# Patient Record
Sex: Male | Born: 1975 | Race: Black or African American | Hispanic: No | Smoking: Never smoker
Health system: Southern US, Community
[De-identification: ages and names within clinical notes are randomized; demographics above are authoritative.]

## PROBLEM LIST (undated history)

## (undated) HISTORY — PX: KNEE SURGERY: SHX244

---

## 2003-11-02 ENCOUNTER — Ambulatory Visit (HOSPITAL_COMMUNITY): Admission: RE | Admit: 2003-11-02 | Discharge: 2003-11-02 | Payer: Self-pay | Admitting: Orthopedic Surgery

## 2003-11-14 ENCOUNTER — Encounter: Admission: RE | Admit: 2003-11-14 | Discharge: 2004-02-12 | Payer: Self-pay | Admitting: Orthopedic Surgery

## 2004-05-06 ENCOUNTER — Ambulatory Visit (HOSPITAL_BASED_OUTPATIENT_CLINIC_OR_DEPARTMENT_OTHER): Admission: RE | Admit: 2004-05-06 | Discharge: 2004-05-06 | Payer: Self-pay | Admitting: Orthopedic Surgery

## 2004-05-17 ENCOUNTER — Emergency Department (HOSPITAL_COMMUNITY): Admission: EM | Admit: 2004-05-17 | Discharge: 2004-05-17 | Payer: Self-pay | Admitting: Family Medicine

## 2004-05-22 ENCOUNTER — Encounter: Admission: RE | Admit: 2004-05-22 | Discharge: 2004-08-20 | Payer: Self-pay | Admitting: Orthopedic Surgery

## 2004-09-20 ENCOUNTER — Emergency Department (HOSPITAL_COMMUNITY): Admission: EM | Admit: 2004-09-20 | Discharge: 2004-09-20 | Payer: Self-pay | Admitting: Emergency Medicine

## 2004-09-29 ENCOUNTER — Emergency Department (HOSPITAL_COMMUNITY): Admission: EM | Admit: 2004-09-29 | Discharge: 2004-09-29 | Payer: Self-pay | Admitting: Family Medicine

## 2004-11-05 ENCOUNTER — Emergency Department (HOSPITAL_COMMUNITY): Admission: EM | Admit: 2004-11-05 | Discharge: 2004-11-05 | Payer: Self-pay | Admitting: Family Medicine

## 2004-11-11 ENCOUNTER — Emergency Department (HOSPITAL_COMMUNITY): Admission: EM | Admit: 2004-11-11 | Discharge: 2004-11-11 | Payer: Self-pay | Admitting: Family Medicine

## 2004-11-21 ENCOUNTER — Encounter: Admission: RE | Admit: 2004-11-21 | Discharge: 2004-12-23 | Payer: Self-pay | Admitting: Orthopedic Surgery

## 2005-08-13 ENCOUNTER — Emergency Department (HOSPITAL_COMMUNITY): Admission: EM | Admit: 2005-08-13 | Discharge: 2005-08-13 | Payer: Self-pay | Admitting: Family Medicine

## 2005-10-18 ENCOUNTER — Emergency Department (HOSPITAL_COMMUNITY): Admission: EM | Admit: 2005-10-18 | Discharge: 2005-10-18 | Payer: Self-pay | Admitting: Family Medicine

## 2006-07-05 ENCOUNTER — Emergency Department (HOSPITAL_COMMUNITY): Admission: EM | Admit: 2006-07-05 | Discharge: 2006-07-05 | Payer: Self-pay | Admitting: Emergency Medicine

## 2008-02-06 ENCOUNTER — Emergency Department (HOSPITAL_COMMUNITY): Admission: EM | Admit: 2008-02-06 | Discharge: 2008-02-06 | Payer: Self-pay | Admitting: Emergency Medicine

## 2008-05-22 ENCOUNTER — Emergency Department (HOSPITAL_COMMUNITY): Admission: EM | Admit: 2008-05-22 | Discharge: 2008-05-22 | Payer: Self-pay | Admitting: Family Medicine

## 2008-10-27 ENCOUNTER — Encounter (INDEPENDENT_AMBULATORY_CARE_PROVIDER_SITE_OTHER): Payer: Self-pay | Admitting: Orthopedic Surgery

## 2008-10-27 ENCOUNTER — Ambulatory Visit: Payer: Self-pay | Admitting: Vascular Surgery

## 2008-10-27 ENCOUNTER — Ambulatory Visit: Admission: RE | Admit: 2008-10-27 | Discharge: 2008-10-27 | Payer: Self-pay | Admitting: Orthopedic Surgery

## 2010-08-11 ENCOUNTER — Emergency Department (HOSPITAL_COMMUNITY)
Admission: EM | Admit: 2010-08-11 | Discharge: 2010-08-11 | Payer: Self-pay | Source: Home / Self Care | Admitting: Emergency Medicine

## 2010-11-29 NOTE — Op Note (Signed)
Jacob Rubio, Jacob Rubio              ACCOUNT NO.:  [id number]   MEDICAL RECORD NO.:  [id number]          PATIENT TYPE:  AMB   LOCATION:  DSC                          FACILITY:  [facility]   PHYSICIAN:  John L. Rendall III, M.D.DATE OF BIRTH:  1976-01-20   DATE OF PROCEDURE:  05/06/2004  DATE OF DISCHARGE:                                 OPERATIVE REPORT   INDICATION AND JUSTIFICATION FOR PROCEDURE:  History of chronic knee pain  after an injury at work with MRI not impressive.  He does, however, have a  painful loose ossicle at the tibial tubercle and ill defined pain about the  patella.   JUSTIFICATION FOR OUTPATIENT SETTING:  Inpatient not required.   PREOPERATIVE DIAGNOSIS:  1.  Painful loose ossicle at tibial tubercle.  2.  Knee pain, question etiology.   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   OPERATIVE PROCEDURE:  1.  Diagnostic arthroscopy with chondroplasty with femoral trochlea and      margin medial femoral condyle.  2.  Excision loose ossicle at tibial tubercle, 1.1 by 1 by 1 cm.   SURGEON:  John L. Rendall, M.D.   ANESTHESIA:  General.   PROCEDURE:  Under a brief general anesthesia, the right knee was prepared  with DuraPrep and draped as a sterile field.  Portals were injected with  Xylocaine with epinephrine.  A 42M pump was used.  Diagnostic arthroscopy  reveals normal menisci and ACL, but there is a 5-6 mm wide defect down the  center of the femoral trochlea over about 2 cm.  The under surface of the  patella is intact.  The margins of this groove, which is down to bone, are  peeling using an intra-articular shaver.  The peeling hyaline cartilage is  resected.  In addition, minor flap of chondromalacia at the margin of the  medial femoral condyle is also resected with the shaver.  Once this is  completed, a 1 inch incision is made over the tibial tubercle.  The  patellar  tendon is separated with an incision right down the middle of the inferior  patellar tendon at the attachment.  Fibers are not cut but an ossicle is  exposed and tendon is removed from the ossicle which is found to be loose.  It is almost like there is a pseudoarthrosis of the tibial tubercle at this  ossicle.  It is essentially shelled out, being a little more than 1 cm by 1  cm by 1 cm in all three dimensions.  Once it is removed, the patellar tendon  is sutured side-to-side with #1 Vicryl mattress sutures, subcu  with 2-0 Vicryl, and the skin with clips.  The wound is infiltrated with  Xylocaine with epinephrine and a sterile compression bandage is applied.  The patient returned to the recovery room in good condition.  He was given  Vicodin for pain.  Recheck in the office next Tuesday.      Daisy Blossom  D:  05/06/2004  T:  05/06/2004  Job:  [id number] 

## 2011-06-10 ENCOUNTER — Other Ambulatory Visit: Payer: Self-pay | Admitting: Rehabilitation

## 2011-06-10 DIAGNOSIS — M545 Low back pain, unspecified: Secondary | ICD-10-CM

## 2011-06-14 ENCOUNTER — Ambulatory Visit
Admission: RE | Admit: 2011-06-14 | Discharge: 2011-06-14 | Disposition: A | Discharge status: Home / Self Care | Payer: Managed Care, Other (non HMO) | Source: Ambulatory Visit | Attending: Rehabilitation | Admitting: Rehabilitation

## 2011-06-14 DIAGNOSIS — M545 Low back pain, unspecified: Secondary | ICD-10-CM

## 2012-01-25 ENCOUNTER — Emergency Department (HOSPITAL_COMMUNITY): Payer: Self-pay

## 2012-01-25 ENCOUNTER — Emergency Department (HOSPITAL_COMMUNITY)
Admission: EM | Admit: 2012-01-25 | Discharge: 2012-01-25 | Disposition: A | Discharge status: Home / Self Care | Payer: Self-pay | Attending: Emergency Medicine | Admitting: Emergency Medicine

## 2012-01-25 ENCOUNTER — Encounter (HOSPITAL_COMMUNITY): Payer: Self-pay | Admitting: *Deleted

## 2012-01-25 DIAGNOSIS — S62339A Displaced fracture of neck of unspecified metacarpal bone, initial encounter for closed fracture: Secondary | ICD-10-CM | POA: Insufficient documentation

## 2012-01-25 MED ORDER — OXYCODONE-ACETAMINOPHEN 5-325 MG PO TABS: 2.0000 | ORAL_TABLET | ORAL | Status: AC | PRN

## 2012-01-25 MED ORDER — IBUPROFEN 800 MG PO TABS: 800.0000 mg | ORAL_TABLET | Freq: Three times a day (TID) | ORAL | Status: AC

## 2012-01-25 MED ORDER — IBUPROFEN 800 MG PO TABS
800.0000 mg | ORAL_TABLET | Freq: Once | ORAL | Status: AC
  Administered 2012-01-25: 800 mg via ORAL
  Filled 2012-01-25: qty 1
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
## 2012-01-25 NOTE — ED Notes (Signed)
Pt presented to ED with hand injury of the rt hand.
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
## 2012-01-25 NOTE — ED Provider Notes (Signed)
Medical screening examination/treatment/procedure(s) were conducted as a shared visit with non-physician practitioner(s) and myself.  I personally evaluated the patient during the encounter  Please see my separate respective documentation pertaining to this patient encounter   Vida Roller, MD 01/25/12 [phone]
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
## 2012-01-25 NOTE — Progress Notes (Signed)
Orthopedic Tech Progress Note Patient Details:  Jacob Rubio 1976-01-08 [id number]  Patient ID: [name], male   DOB: 05-08-1976, 36 y.o.   MRN: [id number]   [name] 01/25/2012, 7:53 AM Applied by Astrid Divine.

## 2012-01-28 NOTE — ED Provider Notes (Signed)
Medical screening examination/treatment/procedure(s) were conducted as a shared visit with non-physician practitioner(s) and myself.  I personally evaluated the patient during the encounter  Please see my separate respective documentation pertaining to this patient encounter   Vida Roller, MD 01/28/12 [phone]

## 2014-03-21 IMAGING — CR DG HAND COMPLETE 3+V*R*
3 series · 3 of 3 positions shown · non-contrast
Comparison: None.

CLINICAL DATA: Right hand injury and swelling status post trauma.

RIGHT HAND - COMPLETE 3+ VIEW

[x hand pa right]
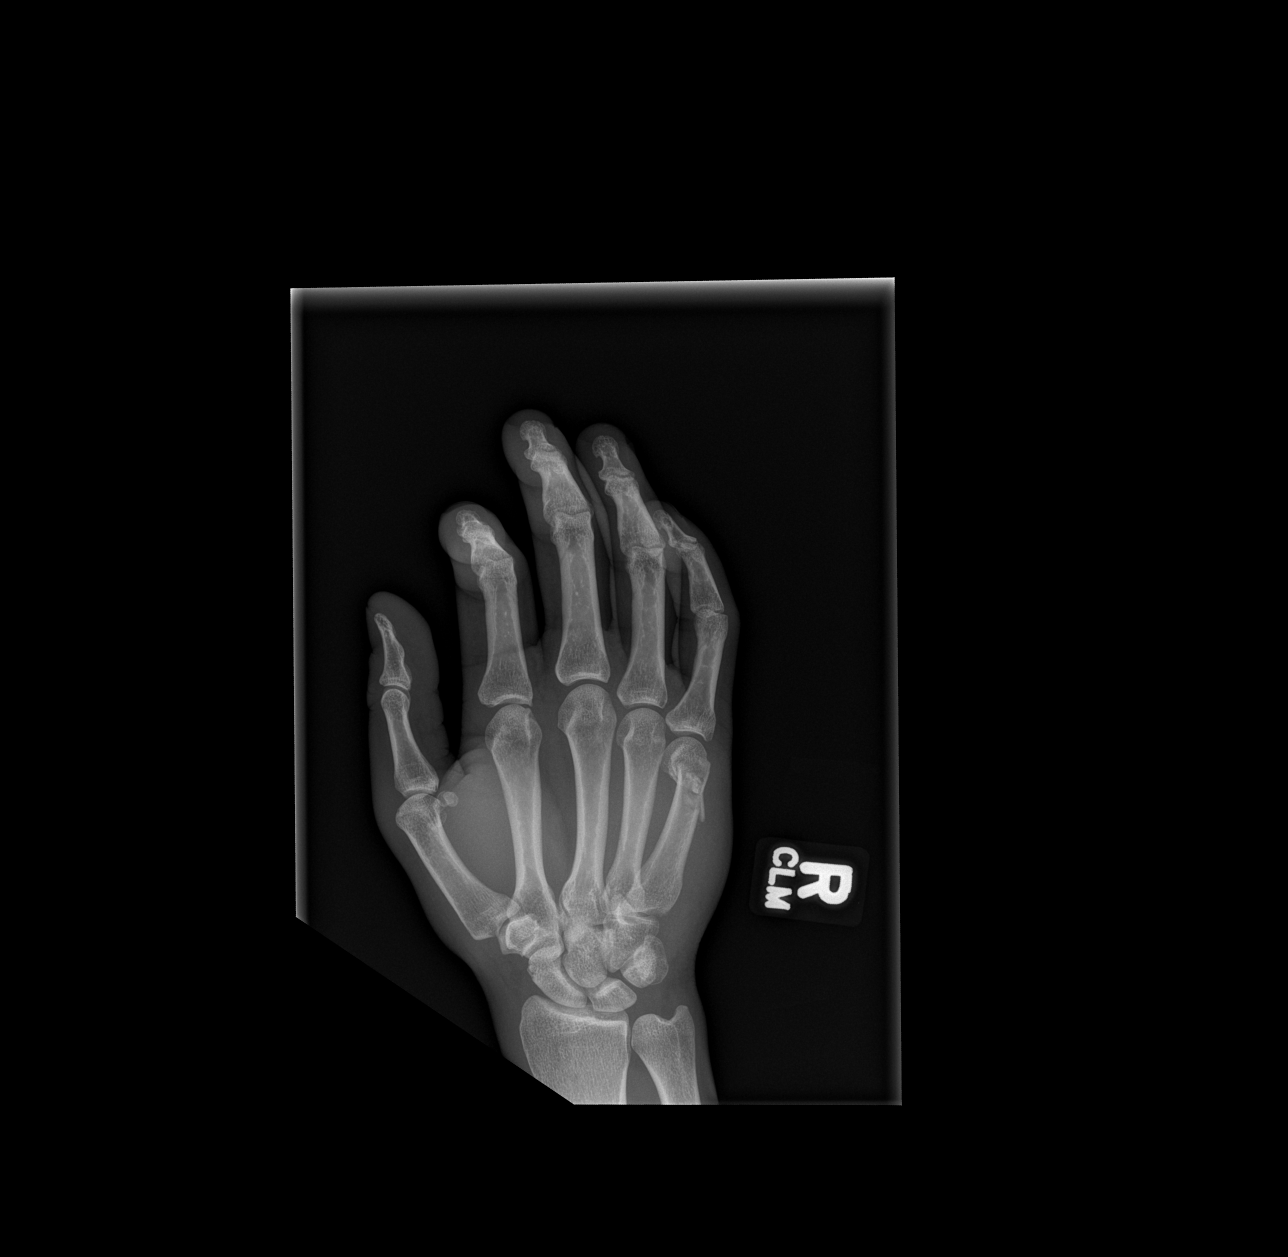

[x hand obl right]
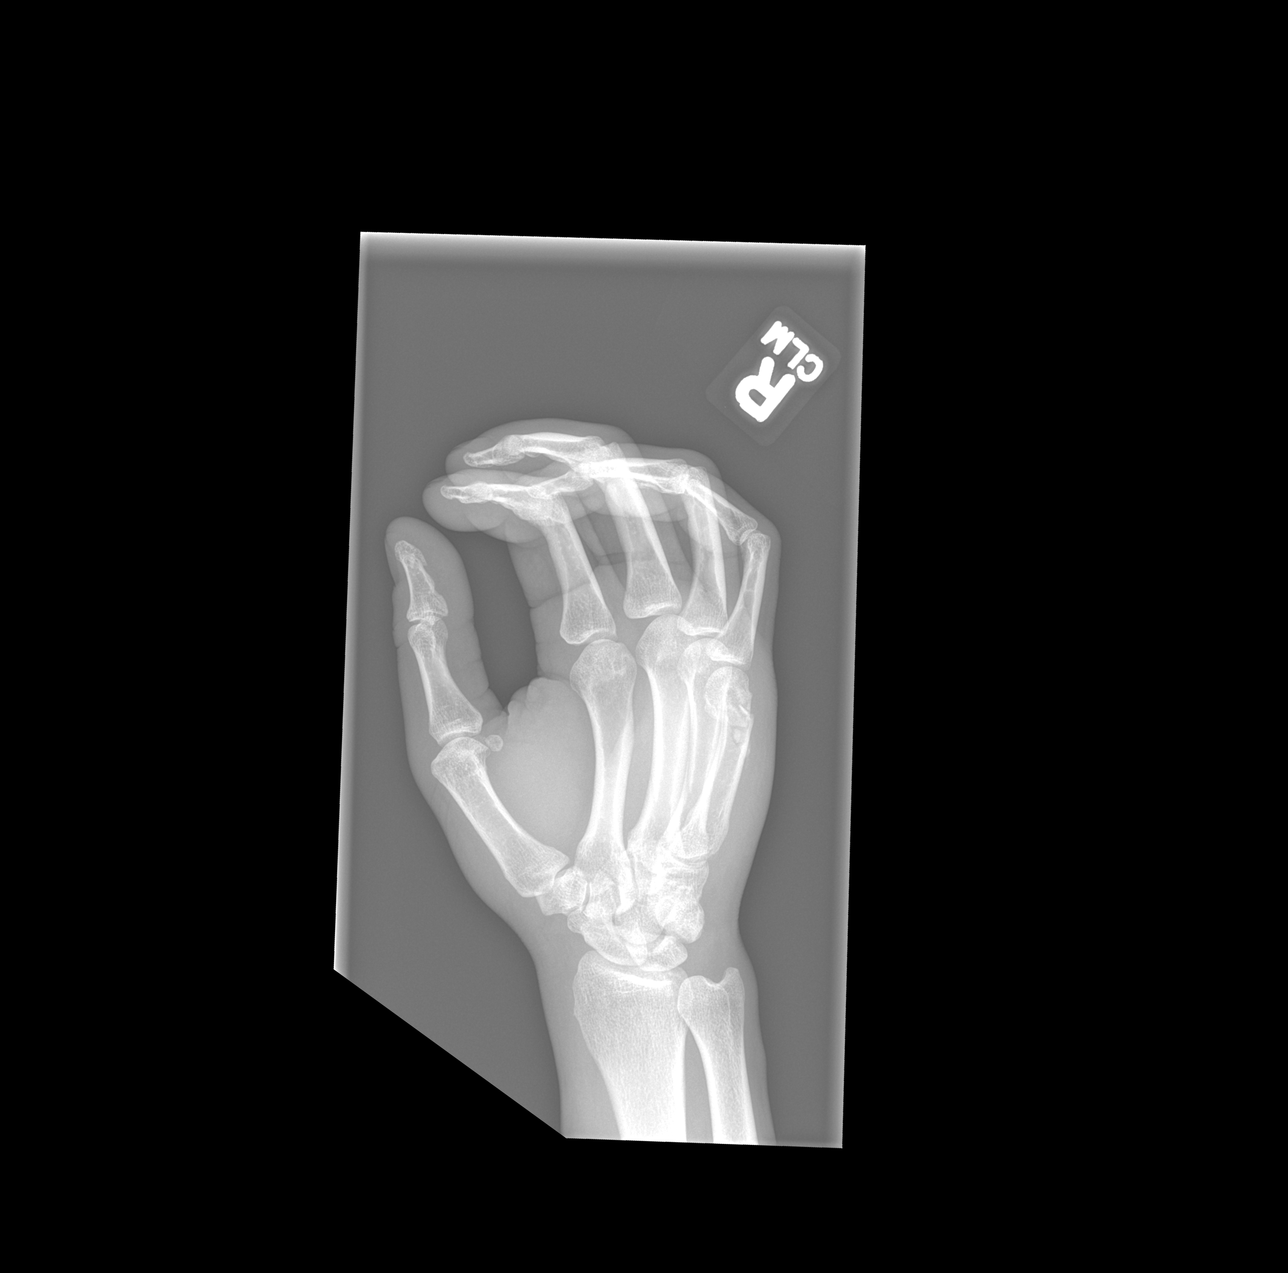

[x hand lat right]
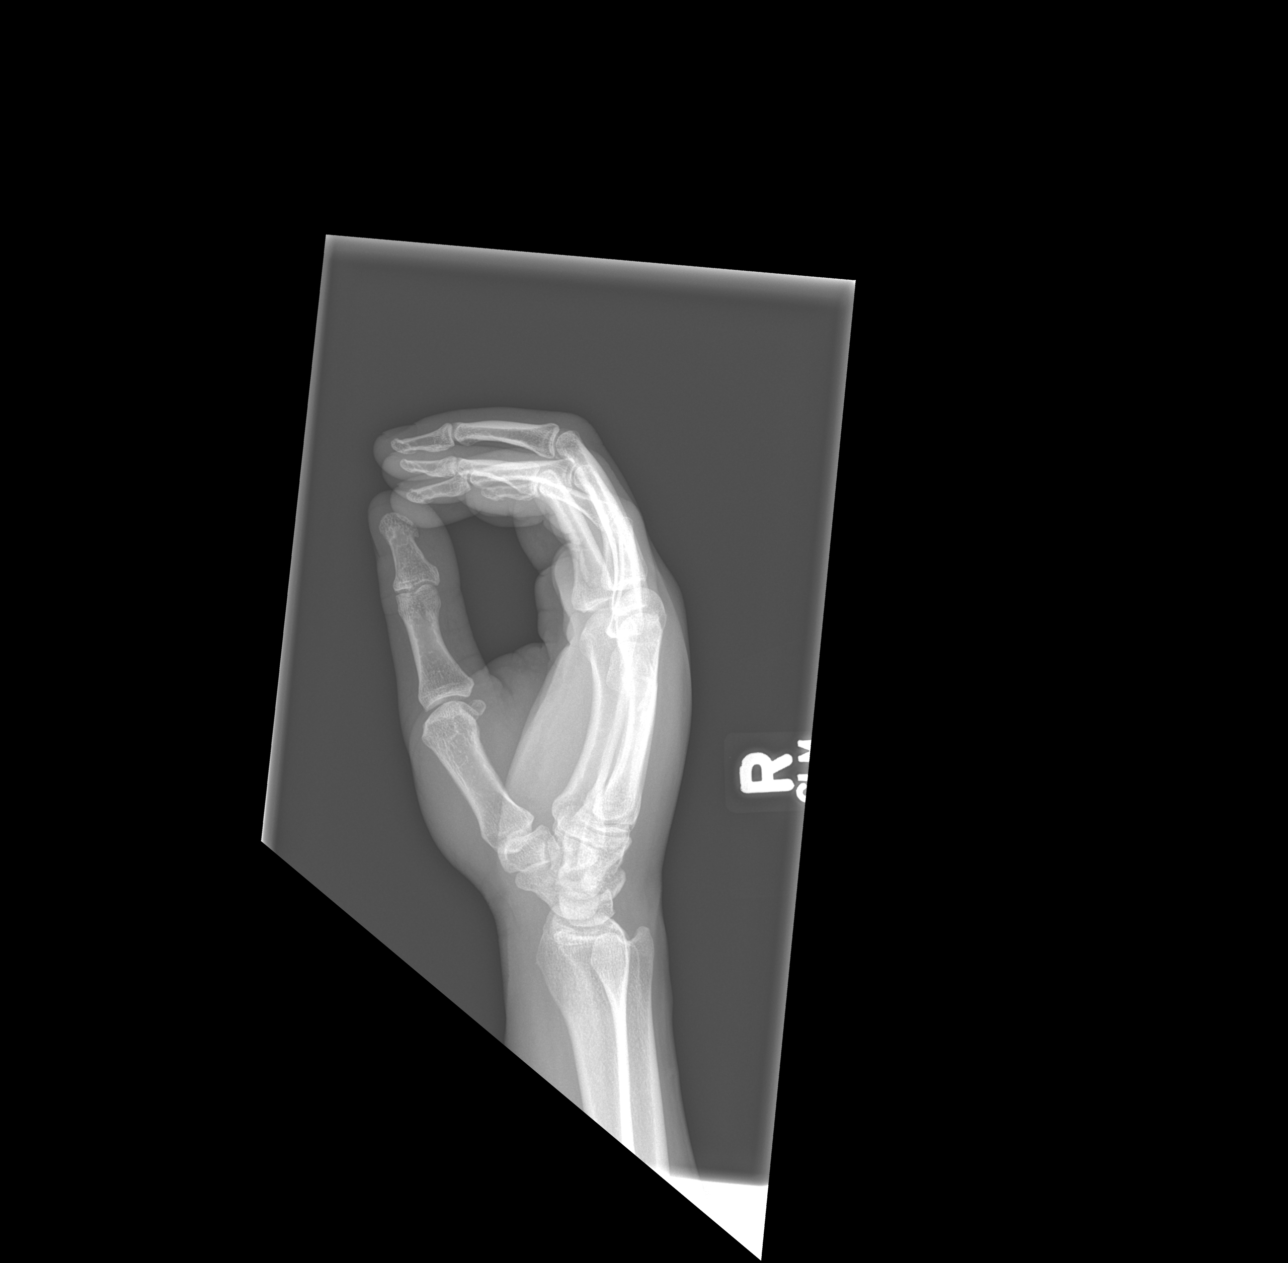

[3 of 3 positions shown; findings below may reference images not displayed]

FINDINGS: Comminuted fracture of the distal aspect of the fifth
metacarpal with volar displacement and angulation.  No intra-
articular extension.  There may be a nondisplaced fracture of the
fourth metacarpal as well. No dislocation.  Overlying soft tissue
swelling.  No radiopaque foreign body.
IMPRESSION: Comminuted fracture of the fifth metacarpal bone.

Question nondisplaced fourth metacarpal bone fracture.

## 2014-11-02 ENCOUNTER — Encounter (HOSPITAL_COMMUNITY): Payer: Self-pay | Admitting: Emergency Medicine

## 2014-11-02 ENCOUNTER — Emergency Department (HOSPITAL_COMMUNITY)
Admission: EM | Admit: 2014-11-02 | Discharge: 2014-11-02 | Disposition: A | Discharge status: Home / Self Care | Payer: PRIVATE HEALTH INSURANCE | Attending: Emergency Medicine | Admitting: Emergency Medicine

## 2014-11-02 DIAGNOSIS — M546 Pain in thoracic spine: Secondary | ICD-10-CM

## 2014-11-02 DIAGNOSIS — M545 Low back pain: Secondary | ICD-10-CM | POA: Diagnosis present

## 2014-11-02 MED ORDER — HYDROCODONE-ACETAMINOPHEN 5-325 MG PO TABS: ORAL_TABLET | ORAL | Status: AC

## 2014-11-02 MED ORDER — DIAZEPAM 5 MG PO TABS
5.0000 mg | ORAL_TABLET | Freq: Four times a day (QID) | ORAL | Status: AC | PRN
Start: 2014-11-02 — End: ?

## 2014-11-02 MED ORDER — KETOROLAC TROMETHAMINE 60 MG/2ML IM SOLN
30.0000 mg | Freq: Once | INTRAMUSCULAR | Status: AC
  Administered 2014-11-02: 30 mg via INTRAMUSCULAR
  Filled 2014-11-02: qty 2

## 2014-11-02 MED ORDER — OXYCODONE-ACETAMINOPHEN 5-325 MG PO TABS
2.0000 | ORAL_TABLET | Freq: Once | ORAL | Status: AC
  Administered 2014-11-02: 2 via ORAL
  Filled 2014-11-02: qty 2

## 2014-11-02 NOTE — ED Provider Notes (Signed)
CSN: [id number]     Arrival date & time 11/02/14  1555 History  This chart was scribed for non-physician practitioner, Wynetta EmeryNicole Quinnetta Roepke, working with Gerhard Munchobert Lockwood, MD by Richarda Overlieichard Holland, ED Scribe. This patient was seen in room WTR6/WTR6 and the patient's care was started at 4:30 PM.    Chief Complaint  Patient presents with  . Back Pain   HPI HPI Comments: Jacob Rubio is a 39 y.o. male who presents to the Emergency Department complaining of worsening, right lower back pain that started last night. He rates his pain as a 10/10 at that time and states that it does not radiate. He denies any injury. Pt states that sitting aggravates his pain. He states that he has taken no medication for his pain since onset. Pt reports no new or increased activity or exercise recently. Pt reports no prior episodes of back pain. Pt denies any history of PE/DVT. He denies any recent extended travel. Pt reports NKDA. He denies cough, SOB, fever, numbness, weakness, bowel or bladder incontinence.   History reviewed. No pertinent past medical history. Past Surgical History  Procedure Laterality Date  . Knee surgery      right   No family history on file. History  Substance Use Topics  . Smoking status: Never Smoker   . Smokeless tobacco: Not on file  . Alcohol Use: Yes    Review of Systems  A complete 10 system review of systems was obtained and all systems are negative except as noted in the HPI and PMH.   Allergies  Review of patient's allergies indicates no known allergies.  Home Medications   Prior to Admission medications   Medication Sig Start Date End Date Taking? Authorizing Provider  diazepam (VALIUM) 5 MG tablet Take 1 tablet (5 mg total) by mouth every 6 (six) hours as needed for anxiety (spasms). 11/02/14   Amaia Lavallie, PA-C  HYDROcodone-acetaminophen (NORCO/VICODIN) 5-325 MG per tablet Take 1-2 tablets by mouth every 6 hours as needed for pain and/or cough. 11/02/14   Kamyla Olejnik, PA-C   BP 150/91 mmHg  Pulse 77  Temp(Src) 97.7 F (36.5 C) (Oral)  Resp 17  SpO2 99% Physical Exam  Constitutional: He is oriented to person, place, and time. He appears well-developed and well-nourished.  HENT:  Head: Normocephalic and atraumatic.  Eyes: Right eye exhibits no discharge. Left eye exhibits no discharge.  Neck: Neck supple. No tracheal deviation present.  Cardiovascular: Normal rate.   Pulmonary/Chest: Effort normal. No respiratory distress.  Abdominal: He exhibits no distension.  Musculoskeletal: He exhibits tenderness.       Back:  No point tenderness to percussion of lumbar spinal processes.  No TTP or paraspinal muscular spasm. Strength is 5 out of 5 to bilateral lower extremities at hip and knee; extensor hallucis longus 5 out of 5. Ankle strength 5 out of 5, no clonus, neurovascularly intact. No saddle anaesthesia. Patellar reflexes are 2+ bilaterally.    Straight leg raise is negative bilaterally.   Neurological: He is alert and oriented to person, place, and time.  Skin: Skin is warm and dry.  Psychiatric: He has a normal mood and affect. His behavior is normal.  Nursing note and vitals reviewed.   ED Course  Procedures   DIAGNOSTIC STUDIES: Oxygen Saturation is 99% on RA, normal by my interpretation.    COORDINATION OF CARE: 4:37 PM Discussed treatment plan with pt at bedside and pt agreed to plan.   Labs Review Labs Reviewed - No  data to display  Imaging Review No results found.   EKG Interpretation None      MDM   Final diagnoses:  Right-sided thoracic back pain    Filed Vitals:   11/02/14 1558  BP: 150/91  Pulse: 77  Temp: 97.7 F (36.5 C)  TempSrc: Oral  Resp: 17  SpO2: 99%    Medications  ketorolac (TORADOL) injection 30 mg (30 mg Intramuscular Given 11/02/14 1647)  oxyCODONE-acetaminophen (PERCOCET/ROXICET) 5-325 MG per tablet 2 tablet (2 tablets Oral Given 11/02/14 1646)    Jacob Rubio is a pleasant  39 y.o. male presenting with focal back pain.  No known trauma. No cough, shortness of breath risk factors, hypoxia to suggest PE. No neurological deficits and normal neuro exam.  Patient can walk but states is painful.  No loss of bowel or bladder control.  No concern for cauda equina.  No fever, night sweats, weight loss, h/o cancer, IVDU.  RICE protocol and pain medicine indicated and discussed with patient.  Evaluation does not show pathology that would require ongoing emergent intervention or inpatient treatment. Pt is hemodynamically stable and mentating appropriately. Discussed findings and plan with patient/guardian, who agrees with care plan. All questions answered. Return precautions discussed and outpatient follow up given.   Discharge Medication List as of 11/02/2014  4:40 PM    START taking these medications   Details  diazepam (VALIUM) 5 MG tablet Take 1 tablet (5 mg total) by mouth every 6 (six) hours as needed for anxiety (spasms)., Starting 11/02/2014, Until Discontinued, Print    HYDROcodone-acetaminophen (NORCO/VICODIN) 5-325 MG per tablet Take 1-2 tablets by mouth every 6 hours as needed for pain and/or cough., Print        I personally performed the services described in this documentation, which was scribed in my presence. The recorded information has been reviewed and is accurate.     Wynetta Emery, PA-C 11/02/14 2022  Gerhard Munch, MD 11/03/14 [phone]

## 2014-11-02 NOTE — Discharge Instructions (Signed)
Do not hesitate to return to the emergency room for any new, worsening or concerning symptoms. ° °Please obtain primary care using resource guide below. But the minute you were seen in the emergency room and that they will need to obtain records for further outpatient management. ° ° ° °Emergency Department Resource Guide °1) Find a Doctor and Pay Out of Pocket °Although you won't have to find out who is covered by your insurance plan, it is a good idea to ask around and get recommendations. You will then need to call the office and see if the doctor you have chosen will accept you as a new patient and what types of options they offer for patients who are self-pay. Some doctors offer discounts or will set up payment plans for their patients who do not have insurance, but you will need to ask so you aren't surprised when you get to your appointment. ° °2) Contact Your Local Health Department °Not all health departments have doctors that can see patients for sick visits, but many do, so it is worth a call to see if yours does. If you don't know where your local health department is, you can check in your phone book. The CDC also has a tool to help you locate your state's health department, and many state websites also have listings of all of their local health departments. ° °3) Find a Walk-in Clinic °If your illness is not likely to be very severe or complicated, you may want to try a walk in clinic. These are popping up all over the country in pharmacies, drugstores, and shopping centers. They're usually staffed by nurse practitioners or physician assistants that have been trained to treat common illnesses and complaints. They're usually fairly quick and inexpensive. However, if you have serious medical issues or chronic medical problems, these are probably not your best option. ° °No Primary Care Doctor: °- Call [facility] at  832-8000 - they can help you locate a primary care doctor that  accepts your  insurance, provides certain services, etc. °- Physician Referral Service- 1-800-533-3463 ° °Chronic Pain Problems: °Organization         Address  Phone   Notes  °[facility]  (336) 297-2271 Patients need to be referred by their primary care doctor.  ° °Medication Assistance: °Organization         Address  Phone   Notes  °[place] [facility] [address] °[place], [place] [address] (336) 641-8030 --Must be a resident of [place] °-- Must have NO insurance coverage whatsoever (no Medicaid/ Medicare, etc.) °-- The pt. MUST have a primary care doctor that directs their care regularly and follows them in the community °  °[facility]  (866) 331-1348   °[employer]  (888) 892-1162   ° °Agencies that provide inexpensive medical care: °Organization         Address  Phone   Notes  °[facility]  (336) 832-8035   °[facility]    (336) 832-7272   °[facility] [address] °[place], [place] [address] (336) 832-4777   °[facility] [address], °[place] (336) 271-4999   °[facility]    (336) 373-0678   °[facility]    (336) 272-1050   °[facility] ° [address], [place] Phone:  (336) 832-4444, Fax:  (336) 832-4440 Hours of Operation:  9 am -   6 pm, M-F.  Also accepts Medicaid/Medicare and self-pay.  °[facility] ° [address], [place] Phone: (336) 832-3150, Fax: (336) 832-3151. Hours of Operation:  8:30 am - 5:30 pm, M-F.  Also accepts Medicaid and self-pay.  °[facility] [place] [address], [place] Phone: (336) 878-6027   °[facility] [address], [place], [place] (336)723-1848, Ext. 123 Mondays & Thursdays: 7-9 AM.  First 15 patients are seen on a first come, first serve basis. °  ° °Medicaid-accepting [place] Providers: ° °Organization          Address  Phone   Notes  °[facility] [address], [place] (336) 641-2100 Also accepts self-pay patients.  °[facility] [address], [place] ° (336) 856-9996   °[facility] [address], [place] (336) 288-8857   °[facility] [address] [place] [address], [place] (336) 299-7000   °Veita Bland [address], [place]  ° (336) 373-1557 Only accepts [place] Access Medicaid patients after they have their name applied to their card.  ° °Self-Pay (no insurance) in [place]: ° °Organization         Address  Phone   Notes  °Sickle Cell Patients, [facility] [address], [place] (336) 832-1970   °[facility] [address], [place] (336) 832-4400   °[facility] ° [address], [place] (336) 992-4800   °[facility]/Dr. Osei-Bonsu ° [address] [place] [address], [place] or [address], [place] (336) 841-8500 Phone number for both [place] and [place] locations is the same.  °[facility] [address], [place] (336) 299-0000   °[facility] [address] [place] [address], [place] or [address] (336) 852-7530 °(336) 878-2260   °[facility] [address], [place] (336) 350-1642, phone; (336) 294-5005, fax Sees patients 1st and 3rd Saturday of every month.  Must not qualify for public or private insurance (i.e. Medicaid, Medicare, [place] Health Choice, Veterans' Benefits) • Household income should be no more than 200% of the poverty level •The clinic cannot treat you if you are pregnant or think you are pregnant • Sexually transmitted diseases are not treated at the clinic.  ° ° °Dental Care: °Organization         Address  Phone  Notes  °[facility] [address],  [place] (336) 641-6152 Accepts children up to age 21 who are enrolled in Medicaid or [place] Health Choice; pregnant women with a Medicaid card; and children who have applied for Medicaid or [place] Health Choice, but were declined, whose parents can pay a reduced fee at time of service.  °[facility] [place]  [address], [place] (336) 641-7733 Accepts children up to age 21 who are enrolled in Medicaid or [place] Health Choice; pregnant women with a Medicaid card; and children who have applied for Medicaid or [place] Health Choice, but were declined, whose parents can pay a reduced fee at time of service.  °[facility] ° [address],  (336) 641-4533 Patients are seen by appointment only. Walk-ins are not accepted. [facility] will see patients 18 years of age and   older. °Monday - Tuesday (8am-5pm) °Most Wednesdays (8:30-5pm) °$30 per visit, cash only  °[facility] ° [address], [place] (336) 641-4533 Patients are seen by appointment only. Walk-ins are not accepted. [facility] will see patients 18 years of age and older. °One Wednesday Evening (Monthly: Volunteer Based).  $30 per visit, cash only  °[facility]  (919) 537-3737 for adults; Children under age 4, call Graduate Pediatric Dentistry at (919) [phone]. Children aged 4-14, please call (919) [phone] to request a pediatric application. ° Dental services are provided in all areas of dental care including fillings, crowns and bridges, complete and partial dentures, implants, gum treatment, root canals, and extractions. Preventive care is also provided. Treatment is provided to both adults and children. °Patients are selected via a lottery and there is often a waiting list. °  °[facility] [address], °[place] ° (336) 763-8833 [email or web address] °  °[facility] [address], [place], [place]  (336)723-1848, Ext. 123 Second and Fourth Thursday of each month, opens at 6:30 AM; Clinic ends at 9 AM.  Patients are seen on a first-come first-served basis, and a limited number are seen during each clinic.  ° °[facility] ° [address], [place], [place] (336) 723-7904   Eligibility Requirements °You must have lived in [place], [place], or [place] counties for at least the last three months. °  You cannot be eligible for state or federal sponsored healthcare insurance, including Veterans Administration, Medicaid, or Medicare. °  You generally cannot be eligible for healthcare insurance through your employer.  °  How to apply: °Eligibility screenings are held every Tuesday and Wednesday afternoon from 1:00 pm until 4:00 pm. You do not need an appointment for the interview!  °[facility] [address], [place], [place] 336-631-2330   °[facility]  336-342-8273   °[facility]  336-703-3100   °[facility]  336-570-6415   ° °Behavioral Health Resources in the Community: °Intensive Outpatient Programs °Organization         Address  Phone  Notes  °[facility] [address], [place], [place] 336-878-6098   °[facility] [address], [place], [place] 336-832-9800   °[facility] [address], [place], [place] ° 336-882-2125   °[facility] [address],  °[place], [place] 1-800-853-5163 or 336-641-4981   °Substance Abuse Resources °Organization         Address  Phone  Notes  °[facility]  336-882-2125   °[facility]  336-784-9470   °[facility]  336-285-9073   °[facility]  336-845-3988   °Residential & Outpatient Substance Abuse Program  1-800-659-3381   °Psychological Services °Organization         Address  Phone  Notes  °[facility]  336- 832-9600   °[facility]  336- 378-7881    °[facility] [address], [place] 1-800-853-5163 or 336-641-4981   ° °Mobile Crisis Teams °Organization         Address  Phone  Notes  °[facility], Mobile Crisis Care Unit  1-877-626-1772   °[facility]ive °[facility] ° [address] [place],  336-834-9664   °Sharon DeEsch [address] °[place]  336-554-5454   ° °Self-Help/Support Groups °Organization         Address    Phone             Notes  °[facility] - variety of support groups  336- 373-1402 Call for more information  °Narcotics Anonymous (NA), [facility] [address], °[place]  2 meetings at this location  ° °Residential Treatment Programs °Organization         Address  Phone  Notes  °[facility] [address],    °[place]  1-866-801-8205   °[facility] ° [address], [place], [place] 704-293-8524   °[facility] [address], [place] 336-845-3988 Admissions: 8am-3pm M-F  °[facility] [address]    °[place], [place] 336-841-1104   °[facility] [address], [place], [place] 336-379-7146   °[facility] [address]  °[place], [place] 336-285-9073   °[facility] Intensive Outpatient [address], [place], [place] 336-852-3033   °[facility] [address]  °[place], [place] 1-877-615-2722 or 336-784-9470   °[facility] [address] [place], [place] 336-227-7417 Accepts Medicaid  °[facility] [address]  °[place] 1-800-659-3381 Substance Abuse/Addiction Treatment  ° °[place] Behavioral Health Resources °Organization         Address  Phone  Notes  °[facility]  (888) 581-9988   °Julie Brannon, PhD [address] [place], [place]   (336) 349-5553 or (336) 951-0000   °[facility]   [address] °[place], [place] (336) 349-4454   °[facility] [address], [place], [place] (336) 342-8316 Insurance/Medicaid/sponsorship through [facility]  °[facility] [address]                                    [place], [place] (336) 342-8316 Therapy/tele-psych/case  °[facility] [address]  ° [place], [place] (336) 349-2233    °Dr. Arfeen  (336) 349-4544   °[facility] [place]  [employer] [place] [facility] 1) [address],  °2) [address], [place] °3)  [address], [place] (336) 349-3220 °(336) 342-7768 ° °(336) 342-8140   °[place] [facility] (336) 342-1394 or (336) 342-3537 (After Hours)    ° ° ° °

## 2014-11-02 NOTE — ED Notes (Signed)
Pt c/o right lower back pain, stating he cannot sit down, it started last night and denies injury. Denies hx of kidney stones or difficulty urinating.

## 2023-06-16 ENCOUNTER — Encounter (HOSPITAL_BASED_OUTPATIENT_CLINIC_OR_DEPARTMENT_OTHER): Payer: Self-pay

## 2023-06-16 ENCOUNTER — Emergency Department (HOSPITAL_BASED_OUTPATIENT_CLINIC_OR_DEPARTMENT_OTHER): Payer: Managed Care, Other (non HMO)

## 2023-06-16 ENCOUNTER — Emergency Department (HOSPITAL_BASED_OUTPATIENT_CLINIC_OR_DEPARTMENT_OTHER)
Admission: EM | Admit: 2023-06-16 | Discharge: 2023-06-16 | Disposition: A | Discharge status: Home / Self Care | Payer: Managed Care, Other (non HMO) | Attending: Emergency Medicine | Admitting: Emergency Medicine

## 2023-06-16 ENCOUNTER — Other Ambulatory Visit: Payer: Self-pay

## 2023-06-16 DIAGNOSIS — R519 Headache, unspecified: Secondary | ICD-10-CM | POA: Insufficient documentation

## 2023-06-16 DIAGNOSIS — Z79899 Other long term (current) drug therapy: Secondary | ICD-10-CM | POA: Insufficient documentation

## 2023-06-16 DIAGNOSIS — R42 Dizziness and giddiness: Secondary | ICD-10-CM | POA: Insufficient documentation

## 2023-06-16 DIAGNOSIS — R079 Chest pain, unspecified: Secondary | ICD-10-CM | POA: Insufficient documentation

## 2023-06-16 LAB — CBC
HCT: 42 % (ref 39.0–52.0)
Hemoglobin: 14.7 g/dL (ref 13.0–17.0)
MCH: 30.2 pg (ref 26.0–34.0)
MCHC: 35 g/dL (ref 30.0–36.0)
MCV: 86.4 fL (ref 80.0–100.0)
Platelets: 220 10*3/uL (ref 150–400)
RBC: 4.86 MIL/uL (ref 4.22–5.81)
RDW: 12.8 % (ref 11.5–15.5)
WBC: 4.5 10*3/uL (ref 4.0–10.5)
nRBC: 0 % (ref 0.0–0.2)

## 2023-06-16 LAB — BASIC METABOLIC PANEL
Anion gap: 8 (ref 5–15)
BUN: 15 mg/dL (ref 6–20)
CO2: 27 mmol/L (ref 22–32)
Calcium: 8.8 mg/dL — ABNORMAL LOW (ref 8.9–10.3)
Chloride: 102 mmol/L (ref 98–111)
Creatinine, Ser: 1.19 mg/dL (ref 0.61–1.24)
GFR, Estimated: >60 mL/min (ref >60)
Glucose, Bld: 95 mg/dL (ref 70–99)
Potassium: 3.6 mmol/L (ref 3.5–5.1)
Sodium: 137 mmol/L (ref 135–145)

## 2023-06-16 LAB — TROPONIN I (HIGH SENSITIVITY)
Troponin I (High Sensitivity): <2 ng/L (ref <18)
Troponin I (High Sensitivity): <2 ng/L (ref <18)

## 2023-06-16 MED ORDER — DIPHENHYDRAMINE HCL 50 MG/ML IJ SOLN
12.5000 mg | Freq: Once | INTRAMUSCULAR | Status: AC
  Administered 2023-06-16: 12.5 mg via INTRAVENOUS
  Filled 2023-06-16: qty 1

## 2023-06-16 MED ORDER — MECLIZINE HCL 25 MG PO TABS: 25.0000 mg | ORAL_TABLET | Freq: Three times a day (TID) | ORAL | 0 refills | Status: AC | PRN

## 2023-06-16 MED ORDER — ASPIRIN 325 MG PO TBEC
325.0000 mg | DELAYED_RELEASE_TABLET | Freq: Once | ORAL | Status: DC
  Filled 2023-06-16: qty 1

## 2023-06-16 MED ORDER — SODIUM CHLORIDE 0.9 % IV BOLUS
1000.0000 mL | Freq: Once | INTRAVENOUS | Status: AC
  Administered 2023-06-16: 1000 mL via INTRAVENOUS

## 2023-06-16 MED ORDER — KETOROLAC TROMETHAMINE 15 MG/ML IJ SOLN
15.0000 mg | Freq: Once | INTRAMUSCULAR | Status: AC
  Administered 2023-06-16: 15 mg via INTRAVENOUS
  Filled 2023-06-16: qty 1

## 2023-06-16 MED ORDER — METOCLOPRAMIDE HCL 5 MG/ML IJ SOLN
10.0000 mg | Freq: Once | INTRAMUSCULAR | Status: AC
  Administered 2023-06-16: 10 mg via INTRAVENOUS
  Filled 2023-06-16: qty 2

## 2023-06-16 MED ORDER — MECLIZINE HCL 25 MG PO TABS
25.0000 mg | ORAL_TABLET | Freq: Once | ORAL | Status: AC
  Administered 2023-06-16: 25 mg via ORAL
  Filled 2023-06-16: qty 1

## 2023-06-16 MED ORDER — LIDOCAINE 5 % EX PTCH
1.0000 | MEDICATED_PATCH | CUTANEOUS | Status: DC
  Administered 2023-06-16: 1 via TRANSDERMAL
  Filled 2023-06-16: qty 1

## 2023-06-16 NOTE — ED Provider Notes (Signed)
[facility] Provider Note   CSN: [id number] Arrival date & time: 06/16/23  1742     History  Chief Complaint  Patient presents with   Dizziness    Jacob Rubio is a 47 y.o. male with no significant past medical history presents the ED today for multiple concerns.  Patient reports that he started having a squeezing pressure localized to the center of his chest several hours prior to arrival as well as dizziness.  He states that he sat down on his bed because he felt dizzy and thinks he might have momentarily blacked out, but is unsure.  He has never experienced anything like this before.  Patient states that the chest pressure has improved significantly at the time of arrival in the ED but still feels dizzy.  Additionally, patient endorses headache across his forehead.  He denies any vision changes, slurred speech, weakness, dysuria, or numbness and tingling sensation.  No additional complaints or concerns at this time.    Home Medications Prior to Admission medications   Medication Sig Start Date End Date Taking? Authorizing Provider  meclizine (ANTIVERT) 25 MG tablet Take 1 tablet (25 mg total) by mouth 3 (three) times daily as needed for dizziness. 06/16/23 07/16/23 Yes Maxwell Marion, PA-C  diazepam (VALIUM) 5 MG tablet Take 1 tablet (5 mg total) by mouth every 6 (six) hours as needed for anxiety (spasms). 11/02/14   Pisciotta, Joni Reining, PA-C  HYDROcodone-acetaminophen (NORCO/VICODIN) 5-325 MG per tablet Take 1-2 tablets by mouth every 6 hours as needed for pain and/or cough. 11/02/14   Pisciotta, Joni Reining, PA-C      Allergies    Patient has no known allergies.    Review of Systems   Review of Systems  Neurological:  Positive for dizziness.  All other systems reviewed and are negative.   Physical Exam Updated Vital Signs BP 128/76   Pulse 82   Temp 98.4 F (36.9 C) (Oral)   Resp 17   Ht 5\' 5"  (1.651 m)   Wt 70.3 kg   SpO2 100%    BMI 25.79 kg/m  Physical Exam Vitals and nursing note reviewed.  Constitutional:      General: He is not in acute distress.    Appearance: Normal appearance.  HENT:     Head: Normocephalic and atraumatic.     Mouth/Throat:     Mouth: Mucous membranes are moist.  Eyes:     Extraocular Movements: Extraocular movements intact.     Conjunctiva/sclera: Conjunctivae normal.     Pupils: Pupils are equal, round, and reactive to light.     Comments: No nystagmus on EOM  Cardiovascular:     Rate and Rhythm: Normal rate and regular rhythm.     Pulses: Normal pulses.     Heart sounds: Normal heart sounds.  Pulmonary:     Effort: Pulmonary effort is normal.     Breath sounds: Normal breath sounds.  Abdominal:     Palpations: Abdomen is soft.     Tenderness: There is no abdominal tenderness.  Skin:    General: Skin is warm and dry.     Findings: No rash.  Neurological:     General: No focal deficit present.     Mental Status: He is alert.     Sensory: No sensory deficit.     Motor: No weakness.     Gait: Gait normal.  Psychiatric:        Mood and Affect: Mood normal.  Behavior: Behavior normal.    Orthostatic Lying BP- Lying: 128/76 Pulse- Lying: 79 Orthostatic Sitting BP- Sitting: 125/84 Pulse- Sitting: 82 Orthostatic Standing at 0 minutes BP- Standing at 0 minutes: 140/90 Pulse- Standing at 0 minutes: 76 Orthostatic Standing at 3 minutes BP- Standing at 3 minutes: 126/93  Pulse- Standing at 3 minutes: 71   ED Results / Procedures / Treatments   Labs (all labs ordered are listed, but only abnormal results are displayed) Labs Reviewed  BASIC METABOLIC PANEL - Abnormal; Notable for the following components:      Result Value   Calcium 8.8 (*)    All other components within normal limits  CBC  TROPONIN I (HIGH SENSITIVITY)  TROPONIN I (HIGH SENSITIVITY)    EKG EKG Interpretation Date/Time:  Tuesday June 16 2023 17:49:37 EST Ventricular Rate:  75 PR  Interval:  161 QRS Duration:  78 QT Interval:  383 QTC Calculation: 428 R Axis:   73  Text Interpretation: Sinus rhythm Confirmed by Vivi Barrack [phone]) on 06/16/2023 8:03:20 PM  Radiology DG Chest Portable 1 View  Result Date: 06/16/2023 CLINICAL DATA:  Chest pain. EXAM: PORTABLE CHEST 1 VIEW COMPARISON:  None Available. FINDINGS: The heart size and mediastinal contours are within normal limits. Both lungs are clear. The visualized skeletal structures are unremarkable. IMPRESSION: No active disease. Electronically Signed   By: Lupita Raider M.D.   On: 06/16/2023 19:39    Procedures Procedures: not indicated.   Medications Ordered in ED Medications  lidocaine (LIDODERM) 5 % 1 patch (1 patch Transdermal Patch Applied 06/16/23 1844)  meclizine (ANTIVERT) tablet 25 mg (25 mg Oral Given 06/16/23 1830)  sodium chloride 0.9 % bolus 1,000 mL (0 mLs Intravenous Stopped 06/16/23 1951)  ketorolac (TORADOL) 15 MG/ML injection 15 mg (15 mg Intravenous Given 06/16/23 1827)  metoCLOPramide (REGLAN) injection 10 mg (10 mg Intravenous Given 06/16/23 1829)  diphenhydrAMINE (BENADRYL) injection 12.5 mg (12.5 mg Intravenous Given 06/16/23 1827)    ED Course/ Medical Decision Making/ A&P                                 Medical Decision Making Amount and/or Complexity of Data Reviewed Labs: ordered. Radiology: ordered.  Risk Prescription drug management.   This patient presents to the ED for concern of dizziness, this involves an extensive number of treatment options, and is a complaint that carries with it a high risk of complications and morbidity.   Differential diagnosis includes: dehydration, electrolyte abnormality, BPPV, Meniere's disease, vestibular neuritis, stroke, etc. Low suspicion for stroke due to lack of neuro symptoms. Discussed with patient the importance of ruling out stroke with dizziness, patient states that he has a family member with a history of stroke and that he knows what  to look out for. With shared decision making, we decided not to order MRI.   Comorbidities  No significant past medical history   Additional History  Additional history obtained from prior records.   Cardiac Monitoring / EKG  The patient was maintained on a cardiac monitor.  I personally viewed and interpreted the cardiac monitored which showed: Sinus rhythm with a heart rate of 75 bpm.   Lab Tests  I ordered and personally interpreted labs.  The pertinent results include:   Initial troponin <2 BMP and CBC are within normal limits - no acute electrolyte derangement, AKI, infection, or anemia.   Imaging Studies  I ordered imaging studies including  CXR  I independently visualized and interpreted imaging which showed: No acute cardiopulmonary processes. I agree with the radiologist interpretation   Problem List / ED Course / Critical Interventions / Medication Management  Dizziness, chest pain, and headache I ordered medications including: Meclizine for dizziness Toradol, Reglan, Benadryl, and normal saline for headache Lidocaine patch for chest pain Reevaluation of the patient after these medicines showed that the patient improved significantly. I have reviewed the patients home medicines and have made adjustments as needed Orthostatics vital signs obtained do not indicate orthostatic hypotension.   Social Determinants of Health  Access to healthcare and   Test / Admission - Considered  Discussed findings with patient.  All questions were answered. He is hemodynamically stable and safe for discharge home. Prescription for Meclizine sent to the pharmacy. Return precautions provided.        Final Clinical Impression(s) / ED Diagnoses Final diagnoses:  Dizziness  Nonspecific chest pain    Rx / DC Orders ED Discharge Orders          Ordered    meclizine (ANTIVERT) 25 MG tablet  3 times daily PRN        06/16/23 1955    Ambulatory referral to [facility]        06/16/23 2032              Maxwell Marion, PA-C 06/16/23 2033    Loetta Rough, MD 06/16/23 [phone]

## 2023-06-16 NOTE — Discharge Instructions (Addendum)
As discussed, your labs and imaging are reassuring.  I have sent a prescription of meclizine to your pharmacy.  You can take this as needed for dizziness.  I have sent a referral to your primary care provider.  They should call you in the next several days to schedule an appointment.  Get help right away if: You vomit each time you eat or drink. You have watery poop and can't eat or drink. You have trouble talking, walking, swallowing, or using your arms, hands, or legs. You feel very weak. You're bleeding. You're not thinking clearly, or you have trouble forming sentences. A friend or family member may spot this. Your vision changes, or you get a very bad headache.

## 2023-06-16 NOTE — ED Triage Notes (Signed)
C/o chest tightness, dizziness & had syncopal episode an hour pta. Denies chest pain at arrival, c/o dizziness.
